# Patient Record
Sex: Male | Born: 1985 | Race: White | Hispanic: No | Marital: Married | Smoking: Never smoker
Health system: Southern US, Community
[De-identification: ages and names within clinical notes are randomized; demographics above are authoritative.]

## PROBLEM LIST (undated history)

## (undated) DIAGNOSIS — K5090 Crohn's disease, unspecified, without complications: Secondary | ICD-10-CM

---

## 2008-02-04 ENCOUNTER — Ambulatory Visit: Payer: Self-pay | Admitting: Unknown Physician Specialty

## 2008-02-08 ENCOUNTER — Ambulatory Visit: Payer: Self-pay | Admitting: Unknown Physician Specialty

## 2008-02-15 ENCOUNTER — Ambulatory Visit: Payer: Self-pay | Admitting: Unknown Physician Specialty

## 2008-05-25 ENCOUNTER — Ambulatory Visit: Payer: Self-pay | Admitting: Unknown Physician Specialty

## 2013-01-27 ENCOUNTER — Ambulatory Visit: Payer: Self-pay | Admitting: Unknown Physician Specialty

## 2015-11-24 ENCOUNTER — Ambulatory Visit
Admission: EM | Admit: 2015-11-24 | Discharge: 2015-11-24 | Disposition: A | Discharge status: Home / Self Care | Payer: BLUE CROSS/BLUE SHIELD | Attending: Family Medicine | Admitting: Family Medicine

## 2015-11-24 ENCOUNTER — Encounter: Payer: Self-pay | Admitting: *Deleted

## 2015-11-24 DIAGNOSIS — M79674 Pain in right toe(s): Secondary | ICD-10-CM | POA: Diagnosis not present

## 2015-11-24 DIAGNOSIS — L089 Local infection of the skin and subcutaneous tissue, unspecified: Secondary | ICD-10-CM

## 2015-11-24 HISTORY — DX: Crohn's disease, unspecified, without complications: K50.90

## 2015-11-24 MED ORDER — SULFAMETHOXAZOLE-TRIMETHOPRIM 800-160 MG PO TABS: 1.0000 | ORAL_TABLET | Freq: Two times a day (BID) | ORAL | 0 refills | Status: AC

## 2015-11-24 NOTE — ED Provider Notes (Signed)
[facility] ____________________________________________  Time seen: Approximately 10:36 AM  I have reviewed the triage vital signs and the nursing notes.   HISTORY  Chief Complaint Toe Injury   HPI Sean Griffith is a 30 y.o. male presents with a complaint of right great toenail pain and drainage. Patient reports 3 days ago he was sitting on the side of his bed and straightened his leg, and accidentally hit the nail on the side of the bed frame. Patient reports he has had discomfort in his toe since but states discomfort has improved. Patient reports he still has mild discomfort directly at the toenail and states that the last 2 days he has noticed a mild amount of fluid coming from beneath the toenail. Denies any redness or swelling. Denies any numbness or tingling sensation. Denies decreased range of motion. Denies any other pain or injury.  Denies fevers. Patient reports continues to eat and drink well. Reports has continued to ambulate without any pain. Denies any other accompanying symptoms. Denies fall or other injury. Denies recent sickness or recent antibiotic use.    Past Medical History:  Diagnosis Date  . Crohn disease (HCC)     There are no active problems to display for this patient.   History reviewed. No pertinent surgical history.  No current facility-administered medications for this encounter.   Current Outpatient Prescriptions:  .  Adalimumab (HUMIRA) 40 MG/0.8ML PSKT, Inject 40 mg into the skin., Disp: , Rfl:  .  cholecalciferol (VITAMIN D) 1000 units tablet, Take 1,000 Units by mouth daily., Disp: , Rfl:  .  Multiple Vitamin (MULTIVITAMIN) capsule, Take 1 capsule by mouth daily., Disp: , Rfl:  .  omeprazole (PRILOSEC) 20 MG capsule, Take 20 mg by mouth daily., Disp: , Rfl:  .  sulfamethoxazole-trimethoprim (BACTRIM DS,SEPTRA DS) 800-160 MG tablet, Take 1 tablet by mouth 2 (two) times daily., Disp: 14 tablet, Rfl: 0  Allergies Review of  patient's allergies indicates no known allergies.  History reviewed. No pertinent family history.  Social History Social History  Substance Use Topics  . Smoking status: Never Smoker  . Smokeless tobacco: Never Used  . Alcohol use No    Review of Systems Constitutional: No fever/chills Eyes: No visual changes. ENT: No sore throat. Cardiovascular: Denies chest pain. Respiratory: Denies shortness of breath. Gastrointestinal: No abdominal pain.  No nausea, no vomiting.  No diarrhea.  No constipation. Genitourinary: Negative for dysuria. Musculoskeletal: Negative for back pain. Skin: Negative for rash. Neurological: Negative for headaches, focal weakness or numbness.  10-point ROS otherwise negative.  ____________________________________________   PHYSICAL EXAM:  VITAL SIGNS: ED Triage Vitals  Enc Vitals Group     BP 11/24/15 0943 117/74     Pulse Rate 11/24/15 0943 60     Resp 11/24/15 0943 16     Temp 11/24/15 0943 98.8 F (37.1 C)     Temp Source 11/24/15 0943 Oral     SpO2 11/24/15 0943 100 %     Weight 11/24/15 0945 170 lb (77.1 kg)     Height 11/24/15 0945 6\' 1"  (1.854 m)     Head Circumference --      Peak Flow --      Pain Score --      Pain Loc --      Pain Edu? --      Excl. in GC? --     Constitutional: Alert and oriented. Well appearing and in no acute distress. Eyes: Conjunctivae are normal. PERRL. EOMI. ENT  Head: Normocephalic and atraumatic.      Mouth/Throat: Mucous membranes are moist.Oropharynx non-erythematous. Cardiovascular: Normal rate, regular rhythm. Grossly normal heart sounds.  Good peripheral circulation. Respiratory: Normal respiratory effort without tachypnea nor retractions. Breath sounds are clear and equal bilaterally. No wheezes/rales/rhonchi.. Musculoskeletal:  Nontender with normal range of motion in all extremities. No midline cervical, thoracic or lumbar tenderness to palpation.  Except: Right great toe distal end of toe  nail mild tenderness to palpation of toenail, toenail well adhered, minimal amount of purulent drainage from beneath distal toenail, no subungual hematoma noted, sensation intact, no bony tenderness, full range of motion, right foot otherwise nontender, no erythema or swelling noted. No other drainage noted. Neurologic:  Normal speech and language. No gross focal neurologic deficits are appreciated. Speech is normal. No gait instability.  Skin:  Skin is warm, dry and intact. No rash noted. Psychiatric: Mood and affect are normal. Speech and behavior are normal. Patient exhibits appropriate insight and judgment   ___________________________________________   LABS (all labs ordered are listed, but only abnormal results are displayed)  Labs Reviewed - No data to display  PROCEDURES Procedures    INITIAL IMPRESSION / ASSESSMENT AND PLAN / ED COURSE  Pertinent labs & imaging results that were available during my care of the patient were reviewed by me and considered in my medical decision making (see chart for details).  Very well-appearing patient. No acute distress. Presents for complaints of right great toenail injury and now with a minimal amount of purulent drainage from beneath toenail. Toenail well adhered. No bony tenderness. Patient declines performing x-ray of toe at this time. Suspect skin infection secondary to toenail injury. Will treat patient with oral antibiotic. Recommend keeping clean and soapy warm water soaks multiple times per day. Will place patient on oral Bactrim. Encourage close monitoring.Discussed indication, risks and benefits of medications with patient.  Discussed follow up with Primary care physician this week. Discussed follow up and return parameters including no resolution or any worsening concerns. Patient verbalized understanding and agreed to plan.   ____________________________________________   FINAL CLINICAL IMPRESSION(S) / ED DIAGNOSES  Final  diagnoses:  Skin infection  Great toe pain, right     Discharge Medication List as of 11/24/2015 10:38 AM    START taking these medications   Details  sulfamethoxazole-trimethoprim (BACTRIM DS,SEPTRA DS) 800-160 MG tablet Take 1 tablet by mouth 2 (two) times daily., Starting Sat 11/24/2015, Until Sat 12/01/2015, Normal        Note: This dictation was prepared with Dragon dictation along with smaller phrase technology. Any transcriptional errors that result from this process are unintentional.    Clinical Course      Renford Dills, NP 11/24/15 1102    Renford Dills, NP 11/24/15 1103

## 2015-11-24 NOTE — Discharge Instructions (Signed)
Take medication as prescribed. Rest. Drink plenty of fluids. Keep clean as discussed.  ° °Follow up with your primary care physician this week as needed. Return to Urgent care for new or worsening concerns.  ° °

## 2015-11-24 NOTE — ED Triage Notes (Signed)
Pt states he struck right big toe on an object that he felt "jammed" toe nail, this was Weds night. Toe nail has become painful with clear/purulent fluid from under nail.

## 2016-02-13 ENCOUNTER — Encounter: Payer: Self-pay | Admitting: *Deleted

## 2016-02-13 ENCOUNTER — Ambulatory Visit
Admission: EM | Admit: 2016-02-13 | Discharge: 2016-02-13 | Disposition: A | Discharge status: Home / Self Care | Payer: BLUE CROSS/BLUE SHIELD | Attending: Emergency Medicine | Admitting: Emergency Medicine

## 2016-02-13 DIAGNOSIS — J069 Acute upper respiratory infection, unspecified: Secondary | ICD-10-CM

## 2016-02-13 DIAGNOSIS — J029 Acute pharyngitis, unspecified: Secondary | ICD-10-CM | POA: Diagnosis not present

## 2016-02-13 LAB — RAPID STREP SCREEN (MED CTR MEBANE ONLY): Streptococcus, Group A Screen (Direct): NEGATIVE

## 2016-02-13 MED ORDER — MOMETASONE FUROATE 50 MCG/ACT NA SUSP: 2.0000 | Freq: Every day | NASAL | 0 refills | Status: AC

## 2016-02-13 MED ORDER — AMOXICILLIN-POT CLAVULANATE 875-125 MG PO TABS: 1.0000 | ORAL_TABLET | Freq: Two times a day (BID) | ORAL | 0 refills | Status: DC

## 2016-02-13 NOTE — ED Provider Notes (Signed)
HPI  SUBJECTIVE:  Sean Griffith is a 30 y.o. male who presents with 5 days of yellowish green nasal congestion, rhinorrhea, postnasal drip, sinus pressure, cough starting last night. He reports sore throat, raspy voice for the past 3 days. He noticed a white spot in one of his tonsils last night. His wife has identical symptoms. He tried Tylenol thousand milligrams which helped symptoms. Also tried Sudafed. Symptoms worse with talking. No sinus pain, allergy symptoms. No wheezing, chest pain, shortness of breath he states that he is able to sleep at night okay. No fevers, ear pain. No antipyretic in the past 6-8 hours. No recent antibiotics. He does not use nasal steroids or saline nasal irrigation a regular basis. Past medical history negative for asthma, eczema, COPD, diabetes, hypertension. He is not a smoker. He does have Crohn's and is on Humira. PMD: None.   Past Medical History:  Diagnosis Date  . Crohn disease (HCC)     History reviewed. No pertinent surgical history.  History reviewed. No pertinent family history.  Social History  Substance Use Topics  . Smoking status: Never Smoker  . Smokeless tobacco: Never Used  . Alcohol use No    No current facility-administered medications for this encounter.   Current Outpatient Prescriptions:  .  Adalimumab (HUMIRA) 40 MG/0.8ML PSKT, Inject 40 mg into the skin., Disp: , Rfl:  .  cholecalciferol (VITAMIN D) 1000 units tablet, Take 1,000 Units by mouth daily., Disp: , Rfl:  .  Multiple Vitamin (MULTIVITAMIN) capsule, Take 1 capsule by mouth daily., Disp: , Rfl:  .  omeprazole (PRILOSEC) 20 MG capsule, Take 20 mg by mouth daily., Disp: , Rfl:  .  amoxicillin-clavulanate (AUGMENTIN) 875-125 MG tablet, Take 1 tablet by mouth 2 (two) times daily. X 7 days, Disp: 14 tablet, Rfl: 0 .  mometasone (NASONEX) 50 MCG/ACT nasal spray, Place 2 sprays into the nose daily., Disp: 17 g, Rfl: 0  No Known Allergies   ROS  As noted in HPI.    Physical Exam  BP 116/73 (BP Location: Left Arm)   Pulse 67   Temp 98.1 F (36.7 C) (Oral)   Resp 16   Ht 6\' 1"  (1.854 m)   Wt 170 lb (77.1 kg)   SpO2 99%   BMI 22.43 kg/m   Constitutional: Well developed, well nourished, no acute distress Eyes:  EOMI, conjunctiva normal bilaterally HENT: Normocephalic, atraumatic,mucus membranes moist. TMs normal bilaterally. Positive erythematous oropharynx, tonsillith right tonsil. No swelling or axilla days. Uvula midline. Positive cobblestoning, postnasal drip. Neck: Positive shotty cervical lymphadenopathy Respiratory: Normal inspiratory effort lungs clear bilaterally good air movement Cardiovascular: Normal rate regular rhythm no murmurs rubs or gallops GI: nondistended skin: No rash, skin intact Musculoskeletal: no deformities Neurologic: Alert & oriented x 3, no focal neuro deficits Psychiatric: Speech and behavior appropriate   ED Course   Medications - No data to display  Orders Placed This Encounter  Procedures  . Rapid strep screen    Standing Status:   Standing    Number of Occurrences:   1    Order Specific Question:   Patient immune status    Answer:   Normal  . Culture, group A strep    Standing Status:   Standing    Number of Occurrences:   1    Results for orders placed or performed during the hospital encounter of 02/13/16 (from the past 24 hour(s))  Rapid strep screen     Status: None   Collection  Time: 02/13/16  9:09 AM  Result Value Ref Range   Streptococcus, Group A Screen (Direct) NEGATIVE NEGATIVE   No results found.  ED Clinical Impression  Upper respiratory tract infection, unspecified type  Sore throat  ED Assessment/Plan   strep negative. Presentation most consistent with a viral URI. Plan to send home with neti pot, Mucinex D, Benadryl/Maalox mixture, Nasonex nasal steroid. 1 g of Tylenol up to 4 times daily as needed for pain and fevers. We'll send home with a wait-and-see prescription of  Augmentin which would cover a sinus infection in addition to a throat infection because he is immunocompromised being on the Humira. Discussed indications for starting the antibiotics. There is no evidence of pneumonia at this time. Will provide primary care referral for ongoing management. He agrees with plan.  Meds ordered this encounter  Medications  . mometasone (NASONEX) 50 MCG/ACT nasal spray    Sig: Place 2 sprays into the nose daily.    Dispense:  17 g    Refill:  0  . amoxicillin-clavulanate (AUGMENTIN) 875-125 MG tablet    Sig: Take 1 tablet by mouth 2 (two) times daily. X 7 days    Dispense:  14 tablet    Refill:  0    *This clinic note was created using Scientist, clinical (histocompatibility and immunogenetics). Therefore, there may be occasional mistakes despite careful proofreading.  ?   Domenick Gong, MD 02/13/16 [phone]

## 2016-02-13 NOTE — Discharge Instructions (Signed)
neti pot, Mucinex D, Benadryl/Maalox mixture, Nasonex nasal steroid. 1 g of Tylenol up to 4 times daily as needed for pain and fevers. wait-and-see prescription of Augmentin- start for the signs and symptoms we discussed.   your rapid strep was negative today, so we have sent off a throat culture.  We will contact you if your culture comes back positive for an infection requiring antibiotic treatment. Definitely start the antibiotics at that time.  Give [place] a working phone number.  If you were given a prescription for antibiotics, you may want to wait and fill it until you know the results of the culture.  Make sure you drink plenty of extra fluids.  Some people find salt water gargles and  Traditional Medicinal's "Throat Coat" tea helpful. Take 5 mL of liquid Benadryl and 5 mL of Maalox. Mix it together, and then hold it in your mouth for as long as you can and then swallow. You may do this 4 times a day.    Go to www.goodrx.com to look up your medications. This will give you a list of where you can find your prescriptions at the most affordable prices.

## 2016-02-13 NOTE — ED Triage Notes (Signed)
Chest and head congestion, sore throat, productive cough- green/yellow, and loss of voice, since Sunday. Denies fever.

## 2016-02-15 LAB — CULTURE, GROUP A STREP (THRC)

## 2016-02-16 ENCOUNTER — Telehealth: Payer: Self-pay | Admitting: *Deleted

## 2016-02-16 NOTE — Telephone Encounter (Signed)
Called patient, no answer, left message communicating negative strep culture results. Advised patient to follow up with PCP or [facility] if symptoms persist.

## 2016-03-09 ENCOUNTER — Ambulatory Visit (INDEPENDENT_AMBULATORY_CARE_PROVIDER_SITE_OTHER): Payer: BLUE CROSS/BLUE SHIELD

## 2016-03-09 ENCOUNTER — Ambulatory Visit
Admission: EM | Admit: 2016-03-09 | Discharge: 2016-03-09 | Disposition: A | Discharge status: Home / Self Care | Payer: BLUE CROSS/BLUE SHIELD | Attending: Family Medicine | Admitting: Family Medicine

## 2016-03-09 ENCOUNTER — Encounter: Payer: Self-pay | Admitting: Gynecology

## 2016-03-09 DIAGNOSIS — R05 Cough: Secondary | ICD-10-CM

## 2016-03-09 DIAGNOSIS — R0789 Other chest pain: Secondary | ICD-10-CM

## 2016-03-09 DIAGNOSIS — J0110 Acute frontal sinusitis, unspecified: Secondary | ICD-10-CM

## 2016-03-09 DIAGNOSIS — R059 Cough, unspecified: Secondary | ICD-10-CM

## 2016-03-09 MED ORDER — DOXYCYCLINE HYCLATE 100 MG PO CAPS: 100.0000 mg | ORAL_CAPSULE | Freq: Two times a day (BID) | ORAL | 0 refills | Status: AC

## 2016-03-09 MED ORDER — HYDROCOD POLST-CPM POLST ER 10-8 MG/5ML PO SUER: 5.0000 mL | Freq: Every evening | ORAL | 0 refills | Status: DC | PRN

## 2016-03-09 NOTE — Discharge Instructions (Signed)
Stop Augmentin- switch to Doxycycline twice a day as directed. May continue Allegra-D or Mucinex-D as directed. Take Tussionex 1 teaspoon at night for cough as needed. Follow-up in 3 to 4 days if not improving.

## 2016-03-09 NOTE — ED Provider Notes (Signed)
CSN: [id number]     Arrival date & time 03/09/16  1051 History   First MD Initiated Contact with Patient 03/09/16 1249     Chief Complaint  Patient presents with  . Cough   (Consider location/radiation/quality/duration/timing/severity/associated sxs/prior Treatment) 30 year old male presents with nasal congestion, cough, low grade fever and left sided rib/chest pain for the past week. Was seen here 02/13/16 for cough and dx with probable viral illness. Was given Augmentin if cough and nasal congestion did not improve but symptoms resolved on it's own so did not take antibiotic. Now experiencing more cough and congestion again. Unable to sleep at night. Talked to [facility] 2 days ago and was prescribed Augmentin, Mucinex-D and Benzonatate which he has been taking with minimal relief. Still had a fever of 100 yesterday and 99 today. Has also taken Tylenol with minimal relief. Requesting re-evaluation today. Has Chron's disease and on Humira.    The history is provided by the patient.    Past Medical History:  Diagnosis Date  . Crohn disease (HCC)    History reviewed. No pertinent surgical history. No family history on file. Social History  Substance Use Topics  . Smoking status: Never Smoker  . Smokeless tobacco: Never Used  . Alcohol use No    Review of Systems  Constitutional: Positive for fatigue and fever. Negative for appetite change and chills.  HENT: Positive for congestion, sinus pain, sinus pressure and sore throat. Negative for ear pain and nosebleeds.   Eyes: Negative for discharge.  Respiratory: Positive for cough and chest tightness. Negative for shortness of breath and wheezing.   Cardiovascular: Positive for chest pain.  Gastrointestinal: Negative for abdominal pain, diarrhea, nausea and vomiting.  Musculoskeletal: Negative for neck pain and neck stiffness.  Skin: Negative for rash.  Allergic/Immunologic: Positive for immunocompromised state.  Neurological: Positive  for headaches. Negative for dizziness, weakness and light-headedness.  Hematological: Negative for adenopathy.    Allergies  Patient has no known allergies.  Home Medications   Prior to Admission medications   Medication Sig Start Date End Date Taking? Authorizing Provider  cholecalciferol (VITAMIN D) 1000 units tablet Take 1,000 Units by mouth daily.   Yes Historical Provider, MD  mometasone (NASONEX) 50 MCG/ACT nasal spray Place 2 sprays into the nose daily. 02/13/16  Yes Domenick GongAshley Mortenson, MD  Multiple Vitamin (MULTIVITAMIN) capsule Take 1 capsule by mouth daily.   Yes Historical Provider, MD  omeprazole (PRILOSEC) 20 MG capsule Take 20 mg by mouth daily.   Yes Historical Provider, MD  Adalimumab (HUMIRA) 40 MG/0.8ML PSKT Inject 40 mg into the skin.    Historical Provider, MD  chlorpheniramine-HYDROcodone (TUSSIONEX PENNKINETIC ER) 10-8 MG/5ML SUER Take 5 mLs by mouth at bedtime as needed for cough. 03/09/16   Sudie GrumblingAnn Berry Dianca Owensby, NP  doxycycline (VIBRAMYCIN) 100 MG capsule Take 1 capsule (100 mg total) by mouth 2 (two) times daily. 03/09/16 03/16/16  Sudie GrumblingAnn Berry Dondrea Clendenin, NP   Meds Ordered and Administered this Visit  Medications - No data to display  BP 107/76 (BP Location: Left Arm)   Pulse 85   Temp 99 F (37.2 C) (Oral)   Resp 16   Wt 170 lb (77.1 kg)   SpO2 100%   BMI 22.43 kg/m  No data found.   Physical Exam  Constitutional: He is oriented to person, place, and time. He appears well-developed and well-nourished. No distress.  HENT:  Head: Normocephalic and atraumatic.  Right Ear: Hearing, external ear and ear canal normal. Tympanic  membrane is bulging.  Left Ear: Hearing, external ear and ear canal normal. Tympanic membrane is bulging. A middle ear effusion is present.  Nose: Mucosal edema and rhinorrhea present. Right sinus exhibits frontal sinus tenderness. Right sinus exhibits no maxillary sinus tenderness. Left sinus exhibits frontal sinus tenderness. Left sinus exhibits  no maxillary sinus tenderness.  Mouth/Throat: Uvula is midline and mucous membranes are normal. Posterior oropharyngeal erythema present.  Neck: Normal range of motion. Neck supple.  Cardiovascular: Normal rate, regular rhythm and normal heart sounds.   Pulmonary/Chest: Effort normal. No respiratory distress. He has decreased breath sounds in the left upper field and the left lower field. He has no wheezes. He has no rhonchi.  Lymphadenopathy:    He has no cervical adenopathy.  Neurological: He is alert and oriented to person, place, and time.  Skin: Skin is warm and dry. Capillary refill takes less than 2 seconds.  Psychiatric: He has a normal mood and affect. His behavior is normal. Judgment and thought content normal.    Urgent Care Course   Clinical Course     Procedures (including critical care time)  Labs Review Labs Reviewed - No data to display  Imaging Review Dg Chest 2 View  Result Date: 03/09/2016 CLINICAL DATA:  Cough for 1 week EXAM: CHEST  2 VIEW COMPARISON:  None. FINDINGS: The heart size and mediastinal contours are within normal limits. Both lungs are clear. The visualized skeletal structures are unremarkable. IMPRESSION: No active cardiopulmonary disease. Electronically Signed   By: Charlett Nose M.D.   On: 03/09/2016 13:25     Visual Acuity Review  Right Eye Distance:   Left Eye Distance:   Bilateral Distance:    Right Eye Near:   Left Eye Near:    Bilateral Near:         MDM   1. Acute non-recurrent frontal sinusitis   2. Cough   3. Left-sided chest wall pain    Discussed negative chest x-ray results with patient. Discussed option of continuing current antibiotic or switching since symptoms are getting worse. Patient decided he would like to switch antibiotics. Recommend stop Augmentin and switch to Doxycycline 100mg  twice a day as directed. Continue Mucinex-D or Allegra-D as directed. Continue Nasonex. Continue Tessalon cough pills during the day.  May take Tussionex 1 teaspoon at night to help with cough. Reviewed [place] Controlled Substance Registry. No active Rx in the past 3 months. Recommend increase fluid intake to help loosen mucus.  Follow-up with his primary care provider in 4 to 5 days if not improving or go to ER if symptoms worsen.      Sudie Grumbling, NP 03/10/16 1114

## 2016-03-09 NOTE — ED Triage Notes (Signed)
Per patient was seen x 3 weeks ago. Per patient recurrent cough and not getting better.

## 2016-12-18 DIAGNOSIS — K649 Unspecified hemorrhoids: Secondary | ICD-10-CM | POA: Insufficient documentation

## 2017-03-23 ENCOUNTER — Other Ambulatory Visit: Payer: Self-pay | Admitting: Unknown Physician Specialty

## 2017-03-23 ENCOUNTER — Ambulatory Visit
Admission: RE | Admit: 2017-03-23 | Discharge: 2017-03-23 | Disposition: A | Discharge status: Home / Self Care | Payer: BLUE CROSS/BLUE SHIELD | Source: Ambulatory Visit | Attending: Unknown Physician Specialty | Admitting: Unknown Physician Specialty

## 2017-03-23 DIAGNOSIS — Z8719 Personal history of other diseases of the digestive system: Secondary | ICD-10-CM

## 2017-03-23 DIAGNOSIS — D72829 Elevated white blood cell count, unspecified: Secondary | ICD-10-CM | POA: Diagnosis present

## 2017-03-23 DIAGNOSIS — R509 Fever, unspecified: Secondary | ICD-10-CM | POA: Insufficient documentation

## 2017-03-23 DIAGNOSIS — K6289 Other specified diseases of anus and rectum: Secondary | ICD-10-CM | POA: Diagnosis present

## 2017-03-23 MED ORDER — IOPAMIDOL (ISOVUE-300) INJECTION 61%
100.0000 mL | Freq: Once | INTRAVENOUS | Status: AC | PRN
  Administered 2017-03-23: 100 mL via INTRAVENOUS

## 2018-03-04 DIAGNOSIS — Z0000 Encounter for general adult medical examination without abnormal findings: Secondary | ICD-10-CM | POA: Diagnosis not present

## 2018-03-05 DIAGNOSIS — Z0000 Encounter for general adult medical examination without abnormal findings: Secondary | ICD-10-CM | POA: Diagnosis not present

## 2018-04-30 DIAGNOSIS — K50813 Crohn's disease of both small and large intestine with fistula: Secondary | ICD-10-CM | POA: Diagnosis not present

## 2018-04-30 DIAGNOSIS — K50811 Crohn's disease of both small and large intestine with rectal bleeding: Secondary | ICD-10-CM | POA: Diagnosis not present

## 2018-05-27 DIAGNOSIS — K6289 Other specified diseases of anus and rectum: Secondary | ICD-10-CM | POA: Diagnosis not present

## 2018-05-27 DIAGNOSIS — K50811 Crohn's disease of both small and large intestine with rectal bleeding: Secondary | ICD-10-CM | POA: Diagnosis not present

## 2018-05-27 DIAGNOSIS — K648 Other hemorrhoids: Secondary | ICD-10-CM | POA: Diagnosis not present

## 2018-05-27 DIAGNOSIS — K640 First degree hemorrhoids: Secondary | ICD-10-CM | POA: Diagnosis not present

## 2018-05-27 DIAGNOSIS — K649 Unspecified hemorrhoids: Secondary | ICD-10-CM | POA: Diagnosis not present

## 2018-08-09 IMAGING — CT CT ABD-PELV W/ CM
2 of 4 series · 16 of 46 positions shown, 18 images · IV contrast (APPLIED)
Comparison: 05/25/2008

CLINICAL DATA: Rectal pain with bowel movements. History of Crohn's
disease. Elevated white blood cell count.

EXAM:
CT ABDOMEN AND PELVIS WITH CONTRAST
TECHNIQUE: Multidetector CT imaging of the abdomen and pelvis was performed
using the standard protocol following bolus administration of
intravenous contrast.
CONTRAST:  100mL BHUMF0-5RR IOPAMIDOL (BHUMF0-5RR) INJECTION 61%

[Series 2: routine abd/pel with · axial · 0.75mm/px · z∈[-620,-165]mm · 13 of 99 slices shown, 15 images]
[im 4/99  soft-tissue]
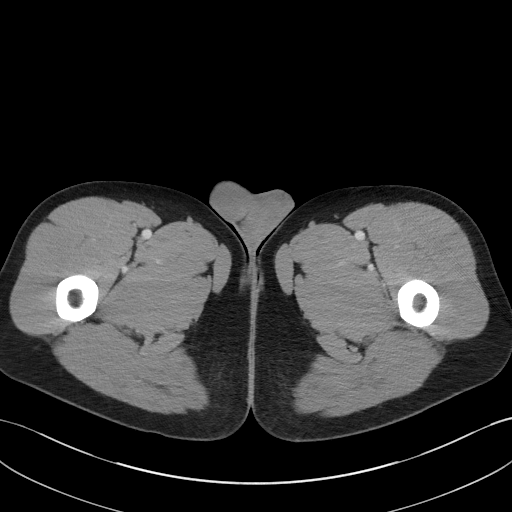
[im 4/99  bone]
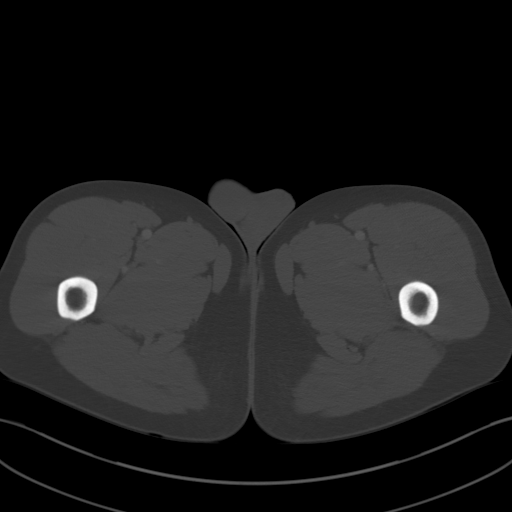
[im 12/99  soft-tissue]
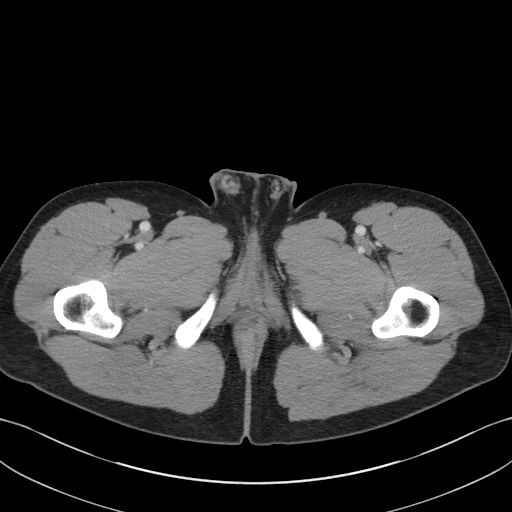
[im 19/99  soft-tissue]
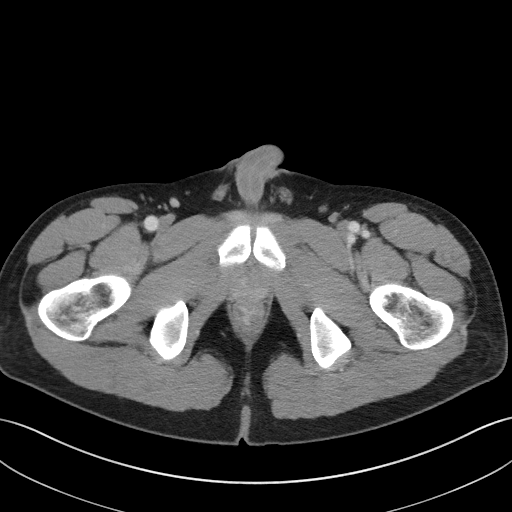
[im 27/99  soft-tissue]
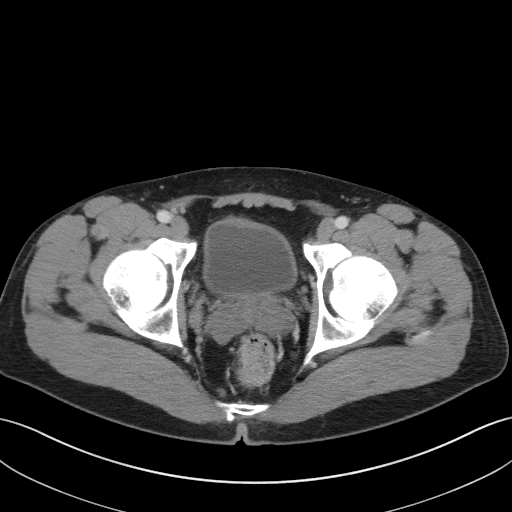
[im 34/99  soft-tissue]
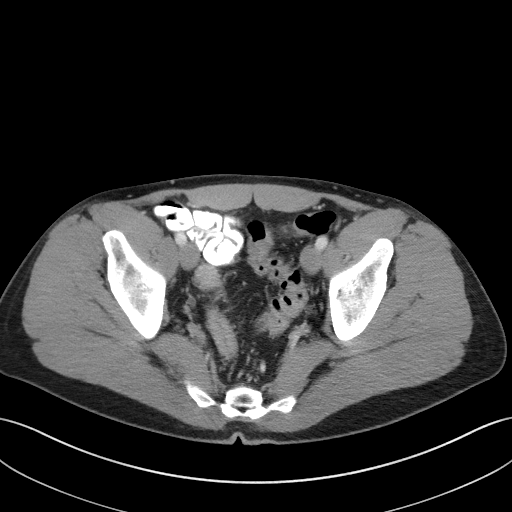
[im 42/99  soft-tissue]
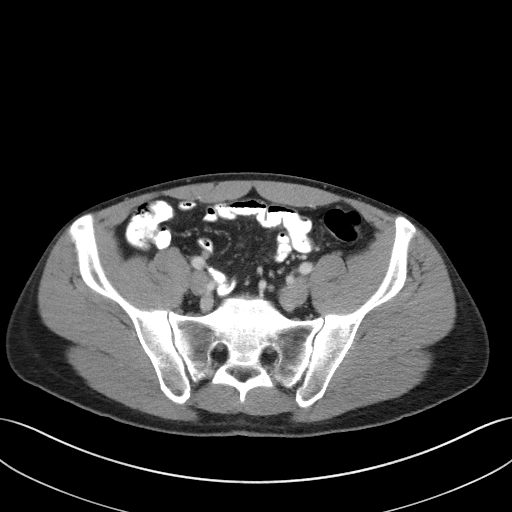
[im 50/99  soft-tissue]
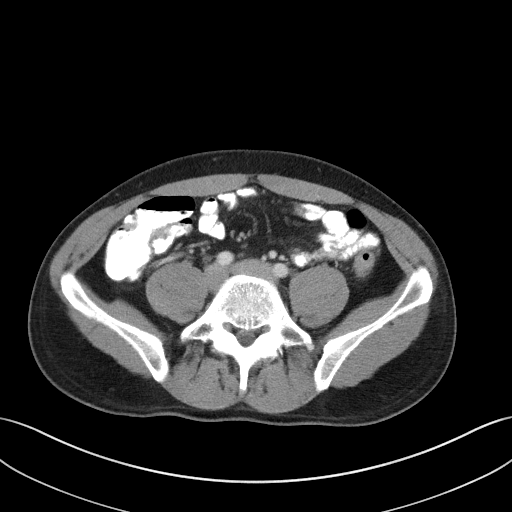
[im 57/99  soft-tissue]
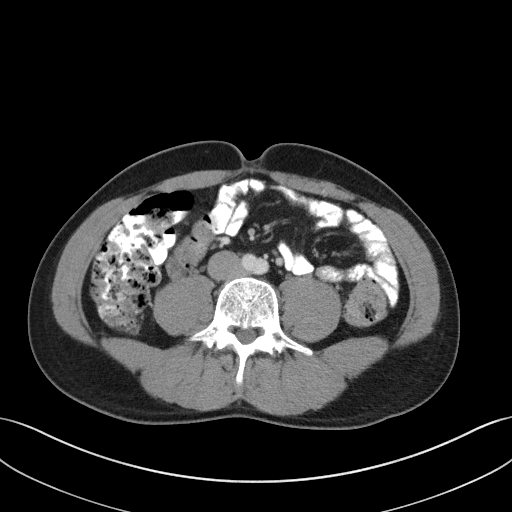
[im 65/99  soft-tissue]
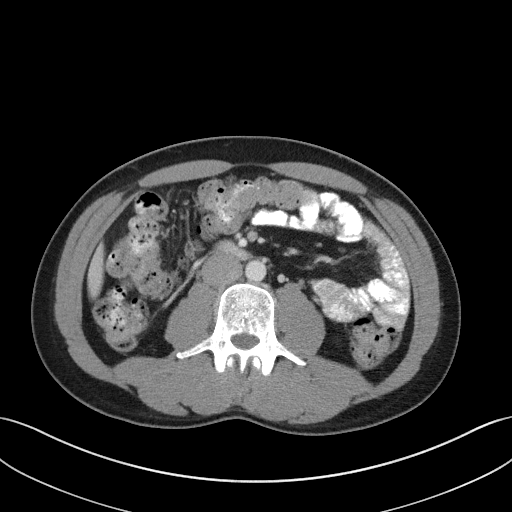
[im 65/99  bone]
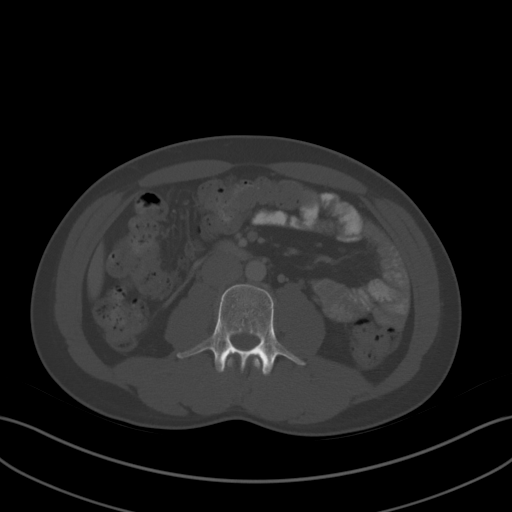
[im 72/99  soft-tissue]
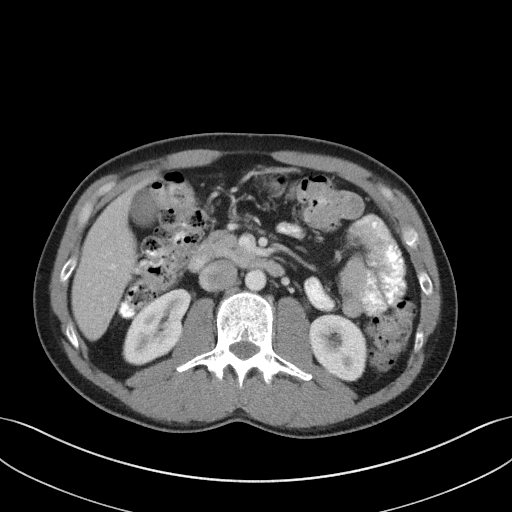
[im 80/99  soft-tissue]
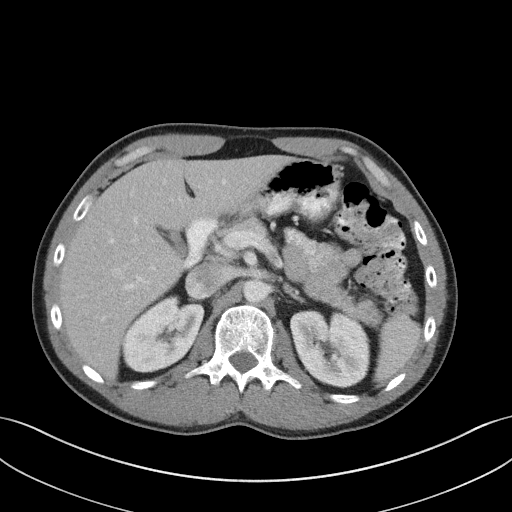
[im 87/99  soft-tissue]
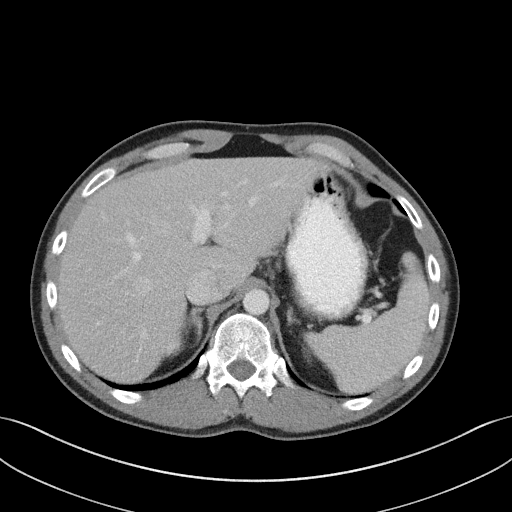
[im 95/99  soft-tissue]
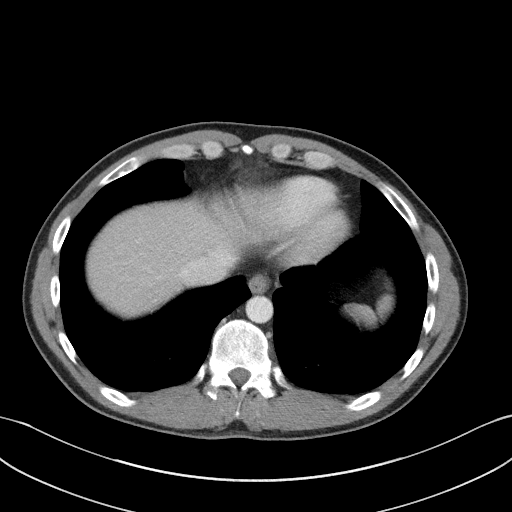

[Series 5: coronal st · coronal · 0.79mm/px · 3 of 83 slices shown]
[im 28/83  soft-tissue]
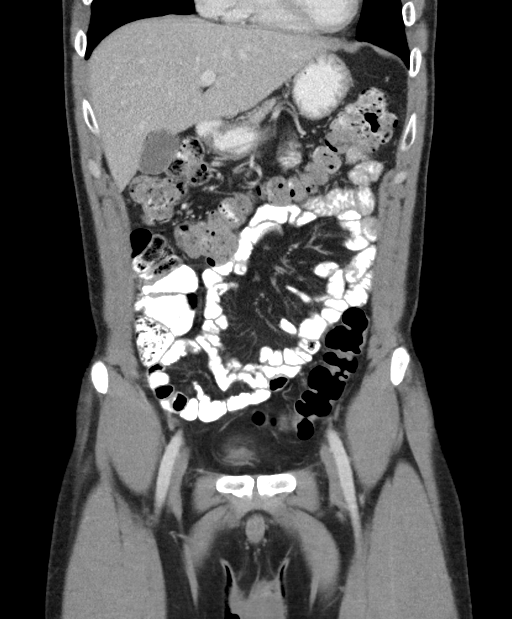
[im 37/83  soft-tissue]
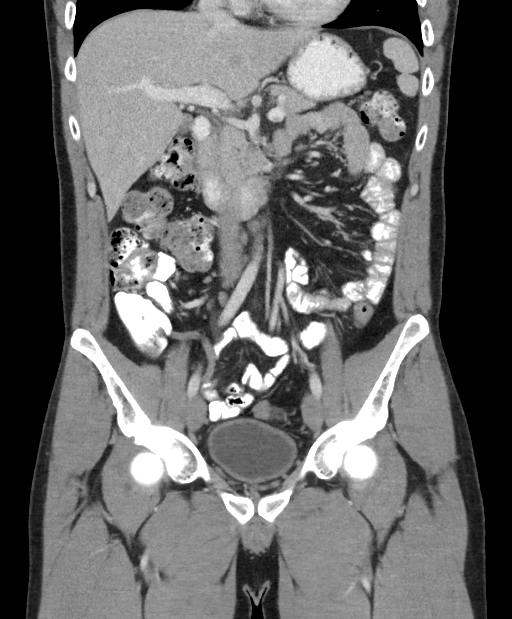
[im 46/83  soft-tissue]
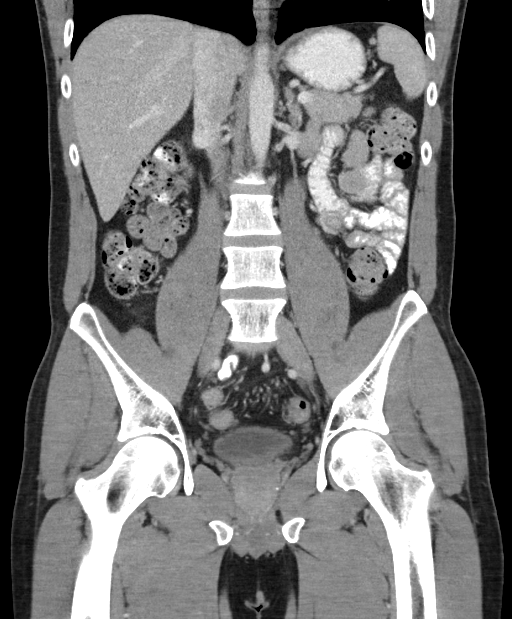

[16 of 46 positions shown; findings below may reference images not displayed]

FINDINGS: Lower chest: Lung bases are clear.

Hepatobiliary: No focal hepatic lesion. No biliary duct dilatation.
Gallbladder is normal. Common bile duct is normal.

Pancreas: Pancreas is normal. No ductal dilatation. No pancreatic
inflammation.

Spleen: Normal spleen

Adrenals/urinary tract: Adrenal glands and kidneys are normal. The
ureters and bladder normal.

Stomach/Bowel: Stomach, and duodenum small bowel are normal. No
mucosal irregularity, stricture or mass. No inflammation of the
terminal ileum. The appendix is normal. Ascending, transverse and
descending colon are normal. No colonic inflammation.

There several round small lymph nodes in the perirectal fat
measuring 5 to 6 mm short axis (image 73 and 74 series 6). No
fistula or abscess.

Additional small lymph nodes in the ileocecal mesenteries which is
not uncommon.

Vascular/Lymphatic: Abdominal aorta is normal caliber. There is no
retroperitoneal or periportal lymphadenopathy. No pelvic
lymphadenopathy.

Reproductive: Prostate normal

Other: No free fluid.

Musculoskeletal: No aggressive osseous lesion.
IMPRESSION: 1. No evidence of active inflammatory bowel disease.
2. Normal terminal ileum.
3. No fistula or abscess.
4. Several small lymph nodes in the perirectal fat without evidence
of acute inflammation or infection.

## 2020-12-28 ENCOUNTER — Other Ambulatory Visit: Payer: Self-pay

## 2020-12-28 ENCOUNTER — Encounter: Payer: Self-pay | Admitting: Urology

## 2020-12-28 ENCOUNTER — Ambulatory Visit (INDEPENDENT_AMBULATORY_CARE_PROVIDER_SITE_OTHER): Payer: 59 | Admitting: Urology

## 2020-12-28 VITALS — BP 114/75 | HR 58 | Ht 73.0 in | Wt 170.0 lb

## 2020-12-28 DIAGNOSIS — Z3009 Encounter for other general counseling and advice on contraception: Secondary | ICD-10-CM

## 2020-12-28 NOTE — Progress Notes (Signed)
12/28/2020 12:14 PM   Sean Griffith May 20, 1985 [id number]  Referring provider: Marisue Ivan, MD [phone] [facility] [address] [facility] [place],  [place] [address]  Chief Complaint  Patient presents with   VAS Consult    HPI: Sean Griffith is a 35 y.o. male who presents for vasectomy counseling.  Married with 3 children Denies prior history urologic problems including chronic scrotal pain, epididymitis or orchitis No previous history inguinal hernia or pelvic surgery No history of bleeding or clotting disorders   PMH: Past Medical History:  Diagnosis Date   Crohn disease (HCC)     Surgical History: No past surgical history on file.  Home Medications:  Allergies as of 12/28/2020       Reactions   Benzalkonium Chloride Rash   Caused itchy rash   Latex Rash        Medication List        Accurate as of December 28, 2020 12:14 PM. If you have any questions, ask your nurse or doctor.          STOP taking these medications    chlorpheniramine-HYDROcodone 10-8 MG/5ML Suer Commonly known as: Tussionex Pennkinetic ER Stopped by: Riki Altes, MD       TAKE these medications    Adalimumab 40 MG/0.8ML Pskt Inject 40 mg into the skin.   cholecalciferol 1000 units tablet Commonly known as: VITAMIN D Take 1,000 Units by mouth daily.   mometasone 50 MCG/ACT nasal spray Commonly known as: Nasonex Place 2 sprays into the nose daily.   multivitamin capsule Take 1 capsule by mouth daily.   omeprazole 20 MG capsule Commonly known as: PRILOSEC Take 20 mg by mouth daily.        Allergies:  Allergies  Allergen Reactions   Benzalkonium Chloride Rash    Caused itchy rash   Latex Rash    Family History: No family history on file.  Social History:  reports that he has never smoked. He has never used smokeless tobacco. He reports that he does not drink alcohol. No history on file for drug use.   Physical Exam: BP 114/75   Pulse (!)  58   Ht 6\' 1"  (1.854 m)   Wt 170 lb (77.1 kg)   BMI 22.43 kg/m   Constitutional:  Alert and oriented, No acute distress. HEENT: [place] AT, moist mucus membranes.  Trachea midline, no masses. Cardiovascular: No clubbing, cyanosis, or edema.  I GU: Phallus without lesions, testes descended bilaterally without masses or tenderness, spermatic cord/epididymis palpably normal bilaterally.  Vasa palpable bilaterally Psychiatric: Normal mood and affect.   Assessment & Plan:    1.  Undesired fertility Desires to schedule vasectomy We had a long discussion about vasectomy. We specifically discussed the procedure, recovery and the risks, benefits and alternatives of vasectomy. I explained that the procedure entails removal of a segment of each vas deferens, each of which conducts sperm, and that the purpose of this procedure is to cause sterility (inability to produce children or cause pregnancy). Vasectomy is intended to be permanent and irreversible form of contraception. Options for fertility after vasectomy include vasectomy reversal, or sperm retrieval with in vitro fertilization. These options are not always successful, and they may be expensive. We discussed reversible forms of birth control such as condoms, IUD or diaphragms, as well as the option of freezing sperm in a sperm bank prior to the vasectomy procedure. We discussed the importance of avoiding strenuous exercise for four days after vasectomy,  and the importance of refraining from any form of ejaculation for seven days after vasectomy. I explained that vasectomy does not produce immediate sterility so another form of contraceptive must be used until sterility is assured by having semen checked for sperm. Thus, a post vasectomy semen analysis is necessary to confirm sterility. Rarely, vasectomy must be repeated. We discussed the approximately 1 in 2,000 risk of pregnancy after vasectomy for men who have post-vasectomy semen analysis showing absent  sperm or rare non-motile sperm. Typical side effects include a small amount of oozing blood, some discomfort and mild swelling in the area of incision.  Vasectomy does not affect sexual performance, function, please, sensation, interest, desire, satisfaction, penile erection, volume of semen or ejaculation. Other rare risks include allergy or adverse reaction to an anesthetic, testicular atrophy, hematoma, infection/abscess, prolonged tenderness of the vas deferens, pain, swelling, painful nodule or scar (called sperm granuloma) or epididymtis. We discussed chronic testicular pain syndrome. This has been reported to occur in as many as 1-2% of men and may be permanent. This can be treated with medication, small procedures or (rarely) surgery. He indicated he would call back if he desires a preprocedure anxiolytic and would need a driver if utilizing   Riki Altes, MD  [facility] [address] [place], [place] [address] [phone]

## 2021-01-04 ENCOUNTER — Telehealth: Payer: Self-pay | Admitting: Urology

## 2021-01-04 NOTE — Telephone Encounter (Signed)
Patient would like someone to give him a call to let him know how much his procedure is going to cost.

## 2021-02-01 ENCOUNTER — Encounter: Payer: 59 | Admitting: Urology

## 2021-02-08 ENCOUNTER — Ambulatory Visit (INDEPENDENT_AMBULATORY_CARE_PROVIDER_SITE_OTHER): Payer: 59 | Admitting: Urology

## 2021-02-08 ENCOUNTER — Other Ambulatory Visit: Payer: Self-pay

## 2021-02-08 VITALS — BP 168/84 | HR 98 | Ht 72.0 in | Wt 175.0 lb

## 2021-02-08 DIAGNOSIS — Z9852 Vasectomy status: Secondary | ICD-10-CM | POA: Diagnosis not present

## 2021-02-08 NOTE — Patient Instructions (Signed)

## 2021-02-10 ENCOUNTER — Encounter: Payer: Self-pay | Admitting: Urology

## 2021-02-10 NOTE — Progress Notes (Signed)
Vasectomy Procedure Note  Indications: DAVARION CUFFEE is a 35 y.o. male who presents today for elective sterilization.  He has been consented for the procedure.  He is aware of the risks and benefits.  He had no additional questions.  He agrees to proceed.  He denies any other significant change since his last visit.  Pre-operative Diagnosis: Elective sterilization  Post-operative Diagnosis: Elective sterilization  Premedication: N/A  Surgeon: Lorin Picket C. Thelda Gagan, M.D  Description: The patient was prepped and draped in the standard fashion.  The right vas deferens was identified but very posterior and was brought  to the lateral scrotal skin.  The skin and vas were then anesthetized utilizing 7 ml 1% lidocaine.  A small stab incision was made and spread with the vas dissector.  The vas was grasped utilizing the vas clamp and elevated out of the incision.  The vas was dissected free from surrounding tissue and vessels and an ~1 cm segment was excised.  The vas lumens were cauterized utilizing electrocautery.  The distal segment was buried in the surrounding sheath with a 3-0 chromic suture.  No significant bleeding was observed.  The vas ends were then dropped back into the hemiscrotum.  The skin was closed with a 3-0 chromic suture.  An identical procedure was performed on the contralateral side. The left vas was brought to the anterior scrotal skin and was closed with hemostatic pressure at the completion of the procedure Clean dry gauze was applied to the incision sites.  The patient tolerated the procedure well.  Complications:None  Recommendations: 1.  No lifting greater than 10 pounds or strenuousactivity for 1 week. 2.  Scrotal support for 1 week. 3.  Shower only for 1 week; may shower in the morning 4.  May resume intercourse in one week if no significant discomfort.  Continue alternate contraception for 12 weeks.  5.  Call for significant pain, swelling, redness, drainage or fever  greater than 100.5. 6.  Rx hydrocodone/APAP 5/325 1-2 every 6 hours as needed for pain. 7.  Follow-up semen analysis in 12 weeks.   Irineo Axon, MD

## 2021-02-19 ENCOUNTER — Encounter: Payer: Self-pay | Admitting: Urology

## 2021-05-14 ENCOUNTER — Other Ambulatory Visit: Payer: 59

## 2021-05-14 ENCOUNTER — Other Ambulatory Visit: Payer: Self-pay

## 2021-05-14 DIAGNOSIS — Z9852 Vasectomy status: Secondary | ICD-10-CM

## 2021-05-16 ENCOUNTER — Telehealth: Payer: Self-pay | Admitting: *Deleted

## 2021-05-16 LAB — POST-VAS SPERM EVALUATION,QUAL: Volume: 3.6 mL

## 2021-05-16 NOTE — Telephone Encounter (Signed)
Notified patient as instructed, patient will come by the office to pick up bag

## 2021-05-16 NOTE — Telephone Encounter (Signed)
-----   Message from Riki Altes, MD sent at 05/16/2021  7:16 AM EST ----- Sperm were present in 3 month postvasectomy sample.  This particular test does not determine if sperm are motile or immotile (alive or dead).  The presence of motile sperm indicates vasectomy failure where immotile sperm does not.  Please schedule postvasectomy sample that includes count and motility determination.  Have patient continue alternate contraception.

## 2021-05-19 ENCOUNTER — Encounter: Payer: Self-pay | Admitting: Urology

## 2021-07-24 ENCOUNTER — Other Ambulatory Visit: Payer: Self-pay | Admitting: Urology

## 2021-07-26 ENCOUNTER — Encounter: Payer: Self-pay | Admitting: Urology

## 2021-07-29 LAB — POST VAS SEMEN ANALYSIS, AUA: Volume: 3.5 mL

## 2021-07-31 ENCOUNTER — Encounter: Payer: Self-pay | Admitting: Urology

## 2021-12-05 ENCOUNTER — Ambulatory Visit: Payer: BLUE CROSS/BLUE SHIELD | Admitting: Family Medicine

## 2021-12-25 NOTE — Progress Notes (Deleted)
  [place] [address] [place] Phone: [phone] Subjective:    I'm seeing this patient by the request  of:  Dion Body, MD  CC:   [id number]  Sean Griffith is a 36 y.o. male coming in with complaint of foot and ankle pain       Past Medical History:  Diagnosis Date   Crohn disease ([place])    No past surgical history on file. Social History   Socioeconomic History   Marital status: Married    Spouse name: Not on file   Number of children: Not on file   Years of education: Not on file   Highest education level: Not on file  Occupational History   Not on file  Tobacco Use   Smoking status: Never   Smokeless tobacco: Never  Substance and Sexual Activity   Alcohol use: No   Drug use: Not on file   Sexual activity: Not on file  Other Topics Concern   Not on file  Social History Narrative   Not on file   Social Determinants of Health   Financial Resource Strain: Not on file  Food Insecurity: Not on file  Transportation Needs: Not on file  Physical Activity: Not on file  Stress: Not on file  Social Connections: Not on file   Allergies  Allergen Reactions   Benzalkonium Chloride Rash    Caused itchy rash   Latex Rash   No family history on file.    Current Outpatient Medications (Respiratory):    mometasone (NASONEX) 50 MCG/ACT nasal spray, Place 2 sprays into the nose daily.  Current Outpatient Medications (Analgesics):    Adalimumab 40 MG/0.8ML PSKT, Inject 40 mg into the skin.   Current Outpatient Medications (Other):    cholecalciferol (VITAMIN D) 1000 units tablet, Take 1,000 Units by mouth daily.   Multiple Vitamin (MULTIVITAMIN) capsule, Take 1 capsule by mouth daily.   omeprazole (PRILOSEC) 20 MG capsule, Take 20 mg by mouth daily.   Reviewed prior external information including notes and imaging from  primary care provider As well as notes that were available from care  everywhere and other healthcare systems.  Past medical history, social, surgical and family history all reviewed in electronic medical record.  No pertanent information unless stated regarding to the chief complaint.   Review of Systems:  No headache, visual changes, nausea, vomiting, diarrhea, constipation, dizziness, abdominal pain, skin rash, fevers, chills, night sweats, weight loss, swollen lymph nodes, body aches, joint swelling, chest pain, shortness of breath, mood changes. POSITIVE muscle aches  Objective  There were no vitals taken for this visit.   General: No apparent distress alert and oriented x3 mood and affect normal, dressed appropriately.  HEENT: Pupils equal, extraocular movements intact  Respiratory: Patient's speak in full sentences and does not appear short of breath  Cardiovascular: No lower extremity edema, non tender, no erythema      Impression and Recommendations:

## 2021-12-26 ENCOUNTER — Ambulatory Visit: Payer: BLUE CROSS/BLUE SHIELD | Admitting: Family Medicine

## 2021-12-27 NOTE — Progress Notes (Unsigned)
Sean Griffith [address] [place] Phone: [phone] Subjective:   IVilma Griffith, am serving as a scribe for Dr. Hulan Saas.  I'm seeing this patient by the request  of:  Sean Body, MD  CC: ankle pain   [id number]  Sean Griffith is a 36 y.o. male coming in with complaint of ankle and leg pain. Strained L calf in July and started PT. Left ankle started to swell then it moved to the right. Was diagnosed with vasculitis. Was active beforehand and wanting to return to being activity. Here for second opinion. Currently on a prednisone taper       Past Medical History:  Diagnosis Date   Crohn disease ([place])    No past surgical history on file. Social History   Socioeconomic History   Marital status: Married    Spouse name: Not on file   Number of children: Not on file   Years of education: Not on file   Highest education level: Not on file  Occupational History   Not on file  Tobacco Use   Smoking status: Never   Smokeless tobacco: Never  Substance and Sexual Activity   Alcohol use: No   Drug use: Not on file   Sexual activity: Not on file  Other Topics Concern   Not on file  Social History Narrative   Not on file   Social Determinants of Health   Financial Resource Strain: Not on file  Food Insecurity: Not on file  Transportation Needs: Not on file  Physical Activity: Not on file  Stress: Not on file  Social Connections: Not on file   Allergies  Allergen Reactions   Benzalkonium Chloride Rash    Caused itchy rash   Latex Rash   No family history on file.    Current Outpatient Medications (Respiratory):    mometasone (NASONEX) 50 MCG/ACT nasal spray, Place 2 sprays into the nose daily.  Current Outpatient Medications (Analgesics):    Adalimumab 40 MG/0.8ML PSKT, Inject 40 mg into the skin.   Current Outpatient Medications (Other):    cholecalciferol (VITAMIN D) 1000 units tablet, Take  1,000 Units by mouth daily.   Multiple Vitamin (MULTIVITAMIN) capsule, Take 1 capsule by mouth daily.   omeprazole (PRILOSEC) 20 MG capsule, Take 20 mg by mouth daily.   Reviewed prior external information including notes and imaging from  primary care provider As well as notes that were available from care everywhere and other healthcare systems.  Past medical history, social, surgical and family history all reviewed in electronic medical record.  No pertanent information unless stated regarding to the chief complaint.   Review of Systems:  No headache, visual changes, nausea, vomiting, diarrhea, constipation, dizziness, abdominal pain, skin rash, fevers, chills, night sweats, weight loss, swollen lymph nodes, Griffith aches, joint swelling, chest pain, shortness of breath, mood changes. POSITIVE muscle aches  Objective  There were no vitals taken for this visit.   General: No apparent distress alert and oriented x3 mood and affect normal, dressed appropriately.  HEENT: Pupils equal, extraocular movements intact  Respiratory: Patient's speak in full sentences and does not appear short of breath  Cardiovascular: No lower extremity edema, non tender, no erythema  Ankle exam shows   Limited muscular skeletal ultrasound was performed and interpreted by Hulan Saas, M       Impression and Recommendations:    The above documentation has been reviewed and is accurate and complete [name]  Tamala Julian, DO

## 2021-12-30 ENCOUNTER — Ambulatory Visit: Payer: Self-pay

## 2021-12-30 ENCOUNTER — Ambulatory Visit (INDEPENDENT_AMBULATORY_CARE_PROVIDER_SITE_OTHER): Payer: 59 | Admitting: Family Medicine

## 2021-12-30 VITALS — BP 122/72 | HR 88 | Ht 72.0 in | Wt 183.0 lb

## 2021-12-30 DIAGNOSIS — M79671 Pain in right foot: Secondary | ICD-10-CM | POA: Diagnosis not present

## 2021-12-30 DIAGNOSIS — I776 Arteritis, unspecified: Secondary | ICD-10-CM | POA: Diagnosis not present

## 2021-12-30 DIAGNOSIS — K5090 Crohn's disease, unspecified, without complications: Secondary | ICD-10-CM | POA: Insufficient documentation

## 2021-12-30 NOTE — Patient Instructions (Addendum)
Heel lift Do prescribed exercises at least 3x a week Can start working out I'll do some reading and get back to you about potential ITP or heavy metal toxicity Would recommend doing another humera, but discontinue other supplementation

## 2021-12-30 NOTE — Assessment & Plan Note (Signed)
Patient has been seen by a dermatologist and stated that is a potential vasculitis.  Concerned that this could be more significant reaction the patient is disease with Crohn's disease.  Discussed with patient that we could also consider this also a postviral.  Will be difficult for secondary to the multiple variables during the time the patient did present.  At this moment patient does not have anything other than some very mild swelling in the posterior tibialis tendon sheath.  Given some exercises that I think will be beneficial.  Do want to continue to monitor though and see how patient responds.  Follow-up with me again in 4 weeks.  Patient will take pictures if anything else is contributing.  Would consider taking labs at the time of true exacerbation I do think it is okay for patient to continue to be active.

## 2022-01-08 ENCOUNTER — Encounter: Payer: Self-pay | Admitting: Family Medicine

## 2022-01-29 NOTE — Progress Notes (Signed)
[place] Phone: [phone] Subjective:   Sean Griffith, am serving as a scribe for Dr. Hulan Saas.  I'm seeing this patient by the request  of:  Dion Body, MD  CC: Right foot pain  [id number]  12/30/2021 Patient has been seen by a dermatologist and stated that is a potential vasculitis.  Concerned that this could be more significant reaction the patient is disease with Crohn's disease.  Discussed with patient that we could also consider this also a postviral.  Will be difficult for secondary to the multiple variables during the time the patient did present.  At this moment patient does not have anything other than some very mild swelling in the posterior tibialis tendon sheath.  Given some exercises that I think will be beneficial.  Do want to continue to monitor though and see how patient responds.  Follow-up with me again in 4 weeks.  Patient will take pictures if anything else is contributing.  Would consider taking labs at the time of true exacerbation I do think it is okay for patient to continue to be active.   Update 02/04/2022 Sean Griffith is a 36 y.o. male coming in with complaint of R foot pain. Patient states that he has been using prednisone and is down to 5mg . Saw dermatologist a couple of weeks ago who recommended colchicine which has helped his pain. Has been taking 2 colchicine and prednisone currently. Is able to be active.        Past Medical History:  Diagnosis Date   Crohn disease ([place])    Griffith past surgical history on file. Social History   Socioeconomic History   Marital status: Married    Spouse name: Not on file   Number of children: Not on file   Years of education: Not on file   Highest education level: Not on file  Occupational History   Not on file  Tobacco Use   Smoking status: Never   Smokeless tobacco: Never  Substance and Sexual Activity   Alcohol use:  Griffith   Drug use: Not on file   Sexual activity: Not on file  Other Topics Concern   Not on file  Social History Narrative   Not on file   Social Determinants of Health   Financial Resource Strain: Not on file  Food Insecurity: Not on file  Transportation Needs: Not on file  Physical Activity: Not on file  Stress: Not on file  Social Connections: Not on file   Allergies  Allergen Reactions   Benzalkonium Chloride Rash    Caused itchy rash   Latex Rash   Griffith family history on file.    Current Outpatient Medications (Respiratory):    mometasone (NASONEX) 50 MCG/ACT nasal spray, Place 2 sprays into the nose daily.  Current Outpatient Medications (Analgesics):    Adalimumab 40 MG/0.8ML PSKT, Inject 40 mg into the skin.   Current Outpatient Medications (Other):    cholecalciferol (VITAMIN D) 1000 units tablet, Take 1,000 Units by mouth daily.   Multiple Vitamin (MULTIVITAMIN) capsule, Take 1 capsule by mouth daily.   omeprazole (PRILOSEC) 20 MG capsule, Take 20 mg by mouth daily.   Reviewed prior external information including notes and imaging from  primary care provider As well as notes that were available from care everywhere and other healthcare systems.  Past medical history, social, surgical and family history all reviewed in electronic medical record.  Griffith pertanent information  unless stated regarding to the chief complaint.   Review of Systems:  Griffith headache, visual changes, nausea, vomiting, diarrhea, constipation, dizziness, abdominal pain, skin rash, fevers, chills, night sweats, weight loss, swollen lymph nodes, body aches, joint swelling, chest pain, shortness of breath, mood changes. POSITIVE muscle aches but mild  Objective  Blood pressure 108/82, height 6' (1.829 m), weight 186 lb (84.4 kg).   General: Griffith apparent distress alert and oriented x3 mood and affect normal, dressed appropriately.  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  Griffith significant edema noted this time on the medial aspect.  Neurovascular intact distally.  2+ DTRs in dorsalis pulse    Impression and Recommendations:    The above documentation has been reviewed and is accurate and complete Judi Saa, DO

## 2022-02-04 ENCOUNTER — Ambulatory Visit: Payer: Self-pay

## 2022-02-04 ENCOUNTER — Ambulatory Visit (INDEPENDENT_AMBULATORY_CARE_PROVIDER_SITE_OTHER): Payer: 59 | Admitting: Family Medicine

## 2022-02-04 VITALS — BP 108/82 | Ht 72.0 in | Wt 186.0 lb

## 2022-02-04 DIAGNOSIS — M25551 Pain in right hip: Secondary | ICD-10-CM

## 2022-02-04 DIAGNOSIS — M79671 Pain in right foot: Secondary | ICD-10-CM | POA: Diagnosis not present

## 2022-02-04 DIAGNOSIS — I776 Arteritis, unspecified: Secondary | ICD-10-CM | POA: Diagnosis not present

## 2022-02-04 NOTE — Patient Instructions (Addendum)
Tart cherry extract 1200mg  at night Ok to work out but stay hydrated and wear compression socks See if there is any association with red meat, shellfish or timing of your Humira See me again if you need me otherwise give me a Mychart message in 2 months

## 2022-02-04 NOTE — Assessment & Plan Note (Addendum)
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  We discussed the over-the-counter possibilities as well that could potentially help this.  We discussed that with how colchicine is working inflammation could be secondary to acidic and we discussed the differences in diet.  In addition to this we did discuss still trying to figure out the primary aspect that could be potentially contributing.  We discussed the Humira again as a potential cause, we discussed now diet changes that could be potentially acute cause, dehydration or is this a vascular component to patient's Crohn's disease.  Patient will continue to keep a diary and keep [place] more informed.  Not having as much tightness of the lower extremities and we will continue to monitor otherwise.  Follow-up again with me more on an as-needed basis and can discuss through MyChart.  Total time with patient 32 minutes

## 2022-04-19 ENCOUNTER — Ambulatory Visit
Admission: EM | Admit: 2022-04-19 | Discharge: 2022-04-19 | Disposition: A | Discharge status: Home / Self Care | Payer: 59

## 2022-04-19 ENCOUNTER — Encounter: Payer: Self-pay | Admitting: Emergency Medicine

## 2022-04-19 DIAGNOSIS — J069 Acute upper respiratory infection, unspecified: Secondary | ICD-10-CM

## 2022-04-19 DIAGNOSIS — R051 Acute cough: Secondary | ICD-10-CM

## 2022-04-19 MED ORDER — PROMETHAZINE-DM 6.25-15 MG/5ML PO SYRP: 5.0000 mL | ORAL_SOLUTION | Freq: Four times a day (QID) | ORAL | 0 refills | Status: AC | PRN

## 2022-04-19 MED ORDER — LEVOFLOXACIN 500 MG PO TABS: 500.0000 mg | ORAL_TABLET | Freq: Every day | ORAL | 0 refills | Status: AC

## 2022-04-19 MED ORDER — IPRATROPIUM BROMIDE 0.06 % NA SOLN: 2.0000 | Freq: Four times a day (QID) | NASAL | 12 refills | Status: AC

## 2022-04-19 MED ORDER — BENZONATATE 100 MG PO CAPS: 200.0000 mg | ORAL_CAPSULE | Freq: Three times a day (TID) | ORAL | 0 refills | Status: AC

## 2022-04-19 NOTE — ED Provider Notes (Signed)
[facility]    CSN: [id number] Arrival date & time: 04/19/22  1259      History   Chief Complaint Chief Complaint  Patient presents with   Cough    HPI Sean Griffith is a 37 y.o. male.   HPI  37 year old male here for evaluation of respiratory complaints  The patient reports that he developed nasal congestion between Christmas and New Year's that continue to worsen and he started to develop thick yellow nasal discharge associated with it.  He states that at some point he thinks he had a fever but he does not have one currently.  Little more than a week ago he did a telehealth appointment and was prescribed Augmentin and prednisone which helped resolve some of the nasal congestion but it still is present.  In the last 8 days he states that he has developed a productive cough for the same thick chunky yellow mucus that he was blowing out of his nose and burning in his chest.  He denies any shortness of breath or wheezing.  Past Medical History:  Diagnosis Date   Crohn disease [facility])     Patient Active Problem List   Diagnosis Date Noted   Crohn disease (HCC) 12/30/2021   Vasculitis (HCC) 12/30/2021   Hemorrhoids 12/18/2016    History reviewed. No pertinent surgical history.     Home Medications    Prior to Admission medications   Medication Sig Start Date End Date Taking? Authorizing Provider  Adalimumab 40 MG/0.8ML PSKT Inject 40 mg into the skin.   Yes [provider]  benzonatate (TESSALON) 100 MG capsule Take 2 capsules (200 mg total) by mouth every 8 (eight) hours. 04/19/22  Yes Becky Augusta, NP  colchicine 0.6 MG tablet Take 0.6 mg by mouth 2 (two) times daily. 01/13/22  Yes [provider]  ipratropium (ATROVENT) 0.06 % nasal spray Place 2 sprays into both nostrils 4 (four) times daily. 04/19/22  Yes Becky Augusta, NP  levofloxacin (LEVAQUIN) 500 MG tablet Take 1 tablet (500 mg total) by mouth daily. 04/19/22  Yes Becky Augusta, NP   Multiple Vitamin (MULTIVITAMIN) capsule Take 1 capsule by mouth daily.   Yes [provider]  promethazine-dextromethorphan (PROMETHAZINE-DM) 6.25-15 MG/5ML syrup Take 5 mLs by mouth 4 (four) times daily as needed. 04/19/22  Yes Becky Augusta, NP  mometasone (NASONEX) 50 MCG/ACT nasal spray Place 2 sprays into the nose daily. 02/13/16   Domenick Gong, MD    Family History History reviewed. No pertinent family history.  Social History Social History   Tobacco Use   Smoking status: Never   Smokeless tobacco: Never  Vaping Use   Vaping Use: Never used  Substance Use Topics   Alcohol use: No   Drug use: Never     Allergies   Benzalkonium chloride and Latex   Review of Systems Review of Systems  Constitutional:  Positive for fever.  HENT:  Positive for congestion and rhinorrhea. Negative for ear pain.   Respiratory:  Positive for cough. Negative for shortness of breath and wheezing.      Physical Exam Triage Vital Signs ED Triage Vitals  Enc Vitals Group     BP 04/19/22 1500 107/73     Pulse Rate 04/19/22 1500 61     Resp 04/19/22 1500 15     Temp 04/19/22 1500 98.5 F (36.9 C)     Temp Source 04/19/22 1500 Oral     SpO2 04/19/22 1500 100 %  Weight 04/19/22 1457 186 lb 1.1 oz (84.4 kg)     Height 04/19/22 1457 6' (1.829 m)     Head Circumference --      Peak Flow --      Pain Score 04/19/22 1457 0     Pain Loc --      Pain Edu? --      Excl. in [place]? --    No data found.  Updated Vital Signs BP 107/73 (BP Location: Left Arm)   Pulse 61   Temp 98.5 F (36.9 C) (Oral)   Resp 15   Ht 6' (1.829 m)   Wt 186 lb 1.1 oz (84.4 kg)   SpO2 100%   BMI 25.24 kg/m   Visual Acuity Right Eye Distance:   Left Eye Distance:   Bilateral Distance:    Right Eye Near:   Left Eye Near:    Bilateral Near:     Physical Exam Vitals and nursing note reviewed.  Constitutional:      Appearance: Normal appearance. He is not ill-appearing.  HENT:     Head:  Normocephalic and atraumatic.     Right Ear: Tympanic membrane, ear canal and external ear normal. There is no impacted cerumen.     Left Ear: Tympanic membrane, ear canal and external ear normal. There is no impacted cerumen.     Nose: Congestion and rhinorrhea present.     Comments: Nasal mucosa is erythematous and edematous with thick yellow discharge in both nares.  Frontal maxillary sinuses are nontender to compression.    Mouth/Throat:     Mouth: Mucous membranes are moist.     Pharynx: Oropharynx is clear. Posterior oropharyngeal erythema present. No oropharyngeal exudate.     Comments: Patient has erythema and injection of the posterior oropharynx with yellow postnasal drip. Cardiovascular:     Rate and Rhythm: Normal rate and regular rhythm.     Pulses: Normal pulses.     Heart sounds: Normal heart sounds. No murmur heard.    No friction rub. No gallop.  Pulmonary:     Effort: Pulmonary effort is normal.     Breath sounds: Normal breath sounds. No wheezing, rhonchi or rales.  Musculoskeletal:     Cervical back: Normal range of motion and neck supple.  Lymphadenopathy:     Cervical: No cervical adenopathy.  Skin:    General: Skin is warm and dry.     Capillary Refill: Capillary refill takes less than 2 seconds.     Findings: No erythema or rash.  Neurological:     General: No focal deficit present.     Mental Status: He is alert and oriented to person, place, and time.  Psychiatric:        Mood and Affect: Mood normal.        Behavior: Behavior normal.        Thought Content: Thought content normal.        Judgment: Judgment normal.      UC Treatments / Results  Labs (all labs ordered are listed, but only abnormal results are displayed) Labs Reviewed - No data to display  EKG   Radiology No results found.  Procedures Procedures (including critical care time)  Medications Ordered in UC Medications - No data to display  Initial Impression / Assessment and  Plan / UC Course  I have reviewed the triage vital signs and the nursing notes.  Pertinent labs & imaging results that were available during my care of the patient  were reviewed by me and considered in my medical decision making (see chart for details).   Patient is a pleasant, nontoxic-appearing 37 year old male who has a significant past medical history to include Crohn's and IgA mediated vasculitis presenting for evaluation of 4 weeks of nasal congestion and cough as outlined in HPI above.  The patient reports that he had a fever at the onset of his symptoms but has not had any fever recently.  He did complete a 7-day course of Augmentin and prednisone and reports that the nasal congestion had improved but he still having some sinus pressure.  He feels like it is now moved into his chest and he started to have a productive cough for the same thick yellow sputum that he was getting from his nasal passages and burning in his chest when he coughs.  He is not in any respiratory distress and does not demonstrate any dyspnea or tachypnea.  He does have inflamed nasal mucosa with thick yellow discharge in both nares.  His sinuses are nontender to compression.  The posterior pharynx is erythematous and he is yellow postnasal drip as well.  Cardiopulmonary exam bisque lung sounds in all fields.  I suspect that his cough is coming from drainage from the upper respiratory tract.  I suspect that the 7 days of Augmentin was not enough to resolve the infection.  I will put him on a 7-day course of 500 mg Levaquin once daily to help resolve the infection along with Atrovent nasal spray to help with the congestion.  I have also discussed with the patient doing sinus irrigation to help alleviate mucus burden and resolve his sinus congestion and pressure.  Tessalon Perles and Promethazine DM cough syrup prescribed for cough and congestion.  Return precautions reviewed.   Final Clinical Impressions(s) / UC Diagnoses    Final diagnoses:  Upper respiratory tract infection, unspecified type  Acute cough     Discharge Instructions      The Levaquin daily with food for 7 days for treatment of your URI.  Perform sinus irrigation 2-3 times a day with a [employer] sinus rinse kit and distilled water.  Do not use tap water.  You can use plain over-the-counter Mucinex every 6 hours to break up the stickiness of the mucus so your body can clear it.  Increase your oral fluid intake to thin out your mucus so that is also able for your body to clear more easily.  Take an over-the-counter probiotic, such as Culturelle-align-activia, 1 hour after each dose of antibiotic to prevent diarrhea.  Use the Atrovent nasal spray, 2 squirts in each nostril every 6 hours, as needed for runny nose and postnasal drip.  Use the Tessalon Perles every 8 hours during the day.  Take them with a small sip of water.  They may give you some numbness to the base of your tongue or a metallic taste in your mouth, this is normal.  Use the Promethazine DM cough syrup at bedtime for cough and congestion.  It will make you drowsy so do not take it during the day.  If you develop any new or worsening symptoms return for reevaluation or see your primary care provider.      ED Prescriptions     Medication Sig Dispense Auth. Provider   levofloxacin (LEVAQUIN) 500 MG tablet Take 1 tablet (500 mg total) by mouth daily. 7 tablet Margarette Canada, NP   ipratropium (ATROVENT) 0.06 % nasal spray Place 2 sprays into both nostrils  4 (four) times daily. 15 mL Becky Augusta, NP   benzonatate (TESSALON) 100 MG capsule Take 2 capsules (200 mg total) by mouth every 8 (eight) hours. 21 capsule Becky Augusta, NP   promethazine-dextromethorphan (PROMETHAZINE-DM) 6.25-15 MG/5ML syrup Take 5 mLs by mouth 4 (four) times daily as needed. 118 mL Becky Augusta, NP      PDMP not reviewed this encounter.   Becky Augusta, NP 04/19/22 1525

## 2022-04-19 NOTE — ED Triage Notes (Signed)
Patient c/o productive cough and chest congestion for 2 weeks.  Patient states that he had a round of antibiotic and prednisone and states that it helped with his sinus congestion but his cough has persisted.  Patient reports yellow thick sputum.  Patient denies fevers.

## 2022-04-19 NOTE — Discharge Instructions (Signed)
The Levaquin daily with food for 7 days for treatment of your URI.  Perform sinus irrigation 2-3 times a day with a [employer] sinus rinse kit and distilled water.  Do not use tap water.  You can use plain over-the-counter Mucinex every 6 hours to break up the stickiness of the mucus so your body can clear it.  Increase your oral fluid intake to thin out your mucus so that is also able for your body to clear more easily.  Take an over-the-counter probiotic, such as Culturelle-align-activia, 1 hour after each dose of antibiotic to prevent diarrhea.  Use the Atrovent nasal spray, 2 squirts in each nostril every 6 hours, as needed for runny nose and postnasal drip.  Use the Tessalon Perles every 8 hours during the day.  Take them with a small sip of water.  They may give you some numbness to the base of your tongue or a metallic taste in your mouth, this is normal.  Use the Promethazine DM cough syrup at bedtime for cough and congestion.  It will make you drowsy so do not take it during the day.  If you develop any new or worsening symptoms return for reevaluation or see your primary care provider.
